# Patient Record
Sex: Female | Born: 1955 | Race: White | Hispanic: No | Marital: Married | Smoking: Former smoker
Health system: Southern US, Community
[De-identification: ages and names within clinical notes are randomized; demographics above are authoritative.]

## PROBLEM LIST (undated history)

## (undated) DIAGNOSIS — K219 Gastro-esophageal reflux disease without esophagitis: Secondary | ICD-10-CM

## (undated) DIAGNOSIS — I10 Essential (primary) hypertension: Secondary | ICD-10-CM

## (undated) DIAGNOSIS — M1990 Unspecified osteoarthritis, unspecified site: Secondary | ICD-10-CM

## (undated) DIAGNOSIS — F419 Anxiety disorder, unspecified: Secondary | ICD-10-CM

## (undated) HISTORY — PX: TONSILLECTOMY: SUR1361

## (undated) HISTORY — PX: TUBAL LIGATION: SHX77

## (undated) HISTORY — PX: ABDOMINAL HYSTERECTOMY: SHX81

## (undated) HISTORY — PX: STAPEDES SURGERY: SHX789

---

## 1997-07-15 ENCOUNTER — Ambulatory Visit (HOSPITAL_COMMUNITY): Admission: RE | Admit: 1997-07-15 | Discharge: 1997-07-15 | Payer: Self-pay | Admitting: Obstetrics and Gynecology

## 1998-11-02 ENCOUNTER — Other Ambulatory Visit: Admission: RE | Admit: 1998-11-02 | Discharge: 1998-11-02 | Payer: Self-pay | Admitting: Obstetrics and Gynecology

## 1999-08-15 ENCOUNTER — Encounter (INDEPENDENT_AMBULATORY_CARE_PROVIDER_SITE_OTHER): Payer: Self-pay | Admitting: Specialist

## 1999-08-15 ENCOUNTER — Inpatient Hospital Stay (HOSPITAL_COMMUNITY): Admission: RE | Admit: 1999-08-15 | Discharge: 1999-08-18 | Payer: Self-pay | Admitting: Obstetrics and Gynecology

## 2002-06-12 ENCOUNTER — Other Ambulatory Visit: Admission: RE | Admit: 2002-06-12 | Discharge: 2002-06-12 | Payer: Self-pay | Admitting: Obstetrics and Gynecology

## 2010-03-03 ENCOUNTER — Encounter: Admission: RE | Admit: 2010-03-03 | Discharge: 2010-03-03 | Payer: Self-pay | Admitting: Obstetrics and Gynecology

## 2010-09-15 NOTE — H&P (Signed)
[facility]  Patient:    Brittany Green, Brittany Green                        MRN: [id number] Adm. Date:  [id number] Attending:  Miguel Aschoff                         History and Physical  ADMISSION DIAGNOSIS:          Menometrorrhagia, pelvic pain, dyspareunia, probable adenomyosis.  BRIEF HISTORY:                The patient is a 55 year old white female para 3-0-0-3 status post prior tubal sterilization who has had a history of persistent irregular and heavy vaginal bleeding, initially evaluated and treated in 1999 at which time she underwent hysteroscopy and D&C, which revealed benign proliferative endometrium.  Following this surgery, patient continued to have heavy irregular  bleeding.  She was offered therapies including hysterectomy, Lupron therapy, or  endometrial ablation.  At that time, she elected to observe how her cycles went. However, pelvic pain and heavy bleeding persisted.  In addition, she developed ot flashes and was noted to have an elevation of her [facility].  She was treated because of her perimenopausal symptoms with ______ hormone replacement therapy, and in spite of this continued to have significant pelvic pain and dyspareunia.  At this point, she desired a definitive procedure be carried out, and at this time elected to proceed with total abdominal hysterectomy.  Patients last Pap smear, November 02, 1998, was satisfactory and within normal limits.  Patient denies any history of  nausea, vomiting, diarrhea, melena, or hematochezia.  She denies dysuria, frequency, urgency, or urinary stress symptoms.  PAST MEDICAL HISTORY:         Positive for history of hypertension, for which she is treated with Accupril and Toprol.  She denies asthma or diabetes.  PAST SURGICAL HISTORY:        Positive for prior tubal sterilization and prior &C.  FAMILY HISTORY:               Noncontributory.  REVIEW OF SYSTEMS:            Review of HEENT is  negative.  CARDIORESPIRATORY review is negative for shortness of breath, cough, or hemoptysis.  GI review is  negative for nausea, vomiting, diarrhea, melena, or hematochezia.  GU review - ee present illness.  GYN review - see present illness.  NEUROMUSCULAR review is negative.  PHYSICAL EXAMINATION:  GENERAL:                      Patient is a well-developed, well-nourished white  female in no acute distress.  VITAL SIGNS:                  Height 5 feet 3 inches, weight 130.  Temperature 97.2, pulse 62 and regular, respirations 18, blood pressure 150/92.  HEENT:                        Head is normocephalic, atraumatic.  Eyes show pupils to be equal and reactive to light and accommodation.  Sclerae white, conjunctivae pink.  Extraocular muscles with full range of motion.  Nose patent without gross lesion.  Tongue is moist and midline.  Throat is clear.  NECK:  Supple.  There is no adenopathy or jugular venous  distension noted.  No thyromegaly is noted.  HEART:                        Regular rhythm with no murmur or gallop.  BREASTS:                      Nontender, no masses are found.  ABDOMEN:                      Soft.  There is no evidence of enlargement of the  liver, kidneys, or spleen.  Abdomen is nontender.  Bowel sounds are active. There is no rebound or guarding.  BACK:                         Reveals no CVA tenderness or deformity.  PELVIC:                       Reveals normal external genitalia.  Normal BUS and Skenes glands, normal urethra.  Vaginal vault is without crusted lesions. Cervix is without crusted lesions.  Uterus is anterior, globular, approximately six to  eight weeks in size.  The adnexa reveal no definite masses.  Rectovaginal exam s confirmatory.  EXTREMITIES:                  Reveal no clubbing, cyanosis, or edema.  SKIN:                         Without gross lesion.  NEUROLOGIC:                   Grossly  intact.  IMPRESSION:                   Menometrorrhagia, pelvic pain, dysmenorrhea, and dyspareunia, suspected adenomyosis.  PLAN:                         Patient to undergo total abdominal hysterectomy and bilateral salpingo-oophorectomy and incidental appendectomy. DD:  08/15/99 TD:  08/15/99 Job: [id number]

## 2010-09-15 NOTE — Discharge Summary (Signed)
[facility]  Patient:    Brittany Green, Brittany Green                        MRN: [id number] Adm. Date:  [id number] Disc. Date: [id number] Attending:  Miguel Aschoff                           Discharge Summary  ADMISSION DIAGNOSES:          1. Menorrhagia.                               2. Pelvic pain.                               3. Dyspareunia.  DISCHARGE DIAGNOSES:          1. Menorrhagia.                               2. Pelvic pain.                               3. Dyspareunia.  PROCEDURE:                    Total abdominal hysterectomy, bilateral salpingo-oophorectomy, and appendectomy.  COMPLICATIONS:                None.  HISTORY OF PRESENT ILLNESS:   The patient is a 55 year old white female, para 3-0-0-3, who has had a prior tubal sterilization, has had a history of persistent, irregular, and very heavy vaginal bleeding.  This is also associated with pelvic pain and dyspareunia.  Because of the patients symptomatology she was offered a  variety of treatments which included Lupron therapy as well as endometrial ablation, but because of her symptoms and her request for definitive therapy, she was admitted to the hospital at this time to undergo total abdominal hysterectomy. In addition, the patient requested that at the time of this surgery an appendectomy be done.  HOSPITAL COURSE:              Preoperative studies were obtained.  This included an admission hemoglobin of 12.7, hematocrit of 38.6, white count 6000, PT and PTT ere within normal limits.  Chemistry profile was within normal limits.  Urinalysis as negative.  Under general anesthesia on August 15, 1999, a total abdominal hysterectomy and bilateral salpingo-oophorectomy were carried out without difficulty as well as an incidental appendectomy.  The patients postoperative course was essentially uncomplicated, however, she did have a drop of her hemoglobin to 8.7.  This stabilized and she  remained essentially asymptomatic tolerating increasing ambulation and diet well.  By April 20, she was in satisfactory condition and felt stable enough to be discharged home.  She was sent home on Tylox one every three hours as needed for pain.  She was instructed to o no heavy lifting, place nothing in the vagina.  DISCHARGE MEDICATIONS:        1. Chromagen one daily.                               2. Premarin 0.625 one daily.  FOLLOW-UP:  The patient will be seen back in four weeks for a  follow-up examination.  She knows to call if there are any problems such as fever, pain, or heavy bleeding.  Pathology report on the hysterectomy specimen revealed adenomyosis and leiomyoma of the uterus. DD:  09/05/99 TD:  09/05/99 Job: [id number]

## 2010-09-15 NOTE — Op Note (Signed)
[facility] [place]  Patient:    Brittany Green, Brittany Green                        MRN: [id number] Proc. Date: 08/15/99 Adm. Date:  [id number] Attending:  Miguel Aschoff                           Operative Report  PREOPERATIVE DIAGNOSIS:       Menometrorrhagia, pelvic pain, dyspareunia, dysmenorrhea.  POSTOPERATIVE DIAGNOSIS:      Menometrorrhagia, pelvic pain, dyspareunia, dysmenorrhea.  Probable adenomyosis.  OPERATION:                    Total abdominal hysterectomy, bilateral salpingo-oophorectomy, incidental appendectomy.  SURGEON:                      Miguel Aschoff, M.D.  ASSISTANT:                    Luvenia Redden, M.D.  ANESTHESIA:                   General anesthesia.  COMPLICATIONS:                None.  ESTIMATED BLOOD LOSS:  INDICATIONS:                  The patient is a 55 year old white female with a history of persistent irregular and heavy vaginal bleeding unresponsive to medical therapy and unresponsive to prior D&C.  In view of the irregular and heavy bleeding associated with pelvic pain, dysmenorrhea, and dyspareunia, she presents now to  undergo definitive therapy via total abdominal hysterectomy.  The patient has also requested that her appendix be removed if this is practical at the time of surgery. The risks and benefits of the procedure have been discussed with the patient.  DESCRIPTION OF PROCEDURE:     The patient was taken to the operating room and placed in the supine position and general anesthesia was administered without difficulty.  She was then prepped and draped in the usual sterile fashion.  A Foley catheter was inserted.  At this point a Pfannenstiel incision was then made. This was extended through the subcutaneous tissue until the fascia was identified. he fascia was then incised transversely.  It was separated from the underlying rectus muscles.  The rectus muscles were divided in the midline.  The peritoneum  was then identified and entered carefully avoiding underlying structures.  The peritoneal incision was extended under direct visualization.  Some filmy anterior omental adhesions were noted and these were taken down without difficulty.  Inspection f the pelvis showed the uterus to be anterior, globular, 6 to 8 weeks in size. The ovaries appeared to be within normal limits.  The tubes were within normal limits. The cul-de-sac was unremarkable.  The appendix was visualized and appeared to be unremarkable.  Intestinal surfaces were unremarkable.  At this point, a self retaining retractor was placed through the wound.  The viscera were packed out f the pelvis and then the uterus was elevated by moving across the utero-ovarian ligaments with Kelly clamps.  At this point, the round ligaments were identified, clamped, cut, and suture ligated using suture ligatures of 0 Vicryl.  The bladder flap was then created anteriorly.  The broad ligament was then skeletonized and  perforations were made bilaterally below the utero-ovarian ligaments and  then the infundibulopelvic ligaments were identified, clamped with curved Heaney clamps,  cut, and doubly ligated using ligatures of 0 Vicryl.  The broad ligament was then skeletonized.  The uterine vessels identified and these were clamped with curved Heaney clamps.  These pedicles were cut and then suture ligated using suture ligatures of 0 Vicryl.  Then in serial fashion, the paracervical fascia, cardinal ligaments, and uterosacral ligaments were clamped, cut, and suture ligated using straight Heaney clamps and suture ligatures of 0 Vicryl.  At this point, the vaginal vault was entered and the cervix was cut free from the vaginal fornices. The angles of the vaginal cuff were then ligated using figure-of-eight sutures f 0 Vicryl.  The uterosacral ligaments were incorporated into the suture ligature to provide support to the cuff.  Then the  cuff was run using running interlocking  Vicryl suture.  Then approximated in the midline using a single 0 Vicryl suture. Inspection was made for hemostasis.  Hemostasis appeared to be excellent.  At this point the pelvic floor was irrigated with warm saline and then reperitonealized  using running continuous 2-0 Vicryl suture.  In doing this, all pedicles were placed in retroperitoneal positions.  The appendix was then visualized.  The mesoappendix was skeletonized.  The base of the appendix identified and then perforation was made in the mesoappendix and two ligatures of 0 Vicryl were placed around the appendix and tied and the mesoappendix was tied using a ligature of  Vicryl.  The appendix was then elevated, cut free, and the base of the appendix was cauterized with electrocautery and the stump of the appendix covered over by using the residual portion of the mesoappendix.  Hemostasis appeared to be excellent t this point.  Lap counts were then taken and found to be correct.  At this point the abdomen was closed.  The parietoperitoneum was closed using running continuous  Vicryl suture.  The rectus muscles were reapproximated using running continuous 0 Vicryl suture.  The fascia was closed using two sutures of 0 Vicryl each starting at the lateral fascial angles and meeting in the midline.  The subcutaneous tissue and skin were closed using staples.  The estimated blood loss was approximately 200 cc.  The patient tolerated the procedure well and went to the recovery room in satisfactory condition. DD:  08/15/99 TD:  08/15/99 Job: [id number]

## 2011-02-14 ENCOUNTER — Other Ambulatory Visit: Payer: Self-pay | Admitting: Otolaryngology

## 2011-02-14 DIAGNOSIS — R220 Localized swelling, mass and lump, head: Secondary | ICD-10-CM

## 2011-02-16 ENCOUNTER — Ambulatory Visit
Admission: RE | Admit: 2011-02-16 | Discharge: 2011-02-16 | Disposition: A | Discharge status: Home / Self Care | Payer: Commercial Indemnity | Source: Ambulatory Visit | Attending: Otolaryngology | Admitting: Otolaryngology

## 2011-02-16 DIAGNOSIS — R220 Localized swelling, mass and lump, head: Secondary | ICD-10-CM

## 2011-02-16 MED ORDER — IOHEXOL 300 MG/ML  SOLN
75.0000 mL | Freq: Once | INTRAMUSCULAR | Status: AC | PRN
  Administered 2011-02-16: 75 mL via INTRAVENOUS

## 2011-11-22 IMAGING — CT CT NECK W/ CM
3 of 4 series · 16 of 33 positions shown, 19 images · IV contrast (omnipaque)
Comparison: None.

CLINICAL DATA: Lump in neck.  Tenderness comes and goes for 4
years.

CT NECK WITH CONTRAST
TECHNIQUE: Multidetector CT imaging of the neck was performed with
intravenous contrast.
Contrast: 75mL OMNIPAQUE IOHEXOL 300 MG/ML IV SOLN

[Series 2: axial neck · axial · 0.39mm/px · z∈[-82,+98]mm · 8 of 92 slices shown, 10 images]
[im 10/92  soft-tissue]
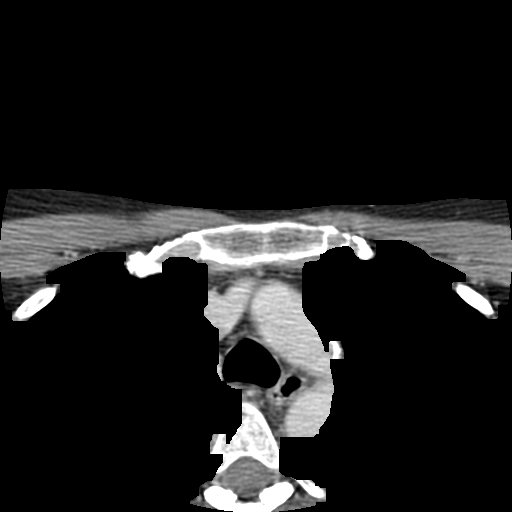
[im 10/92  bone]
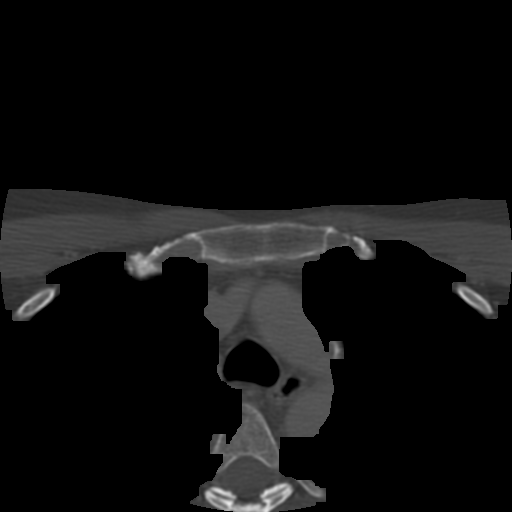
[im 19/92  bone]
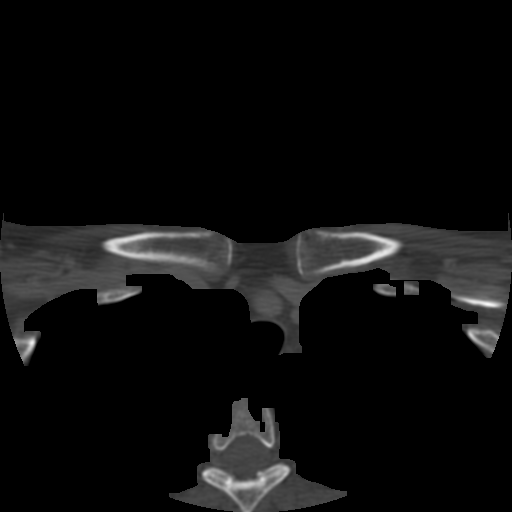
[im 28/92  bone]
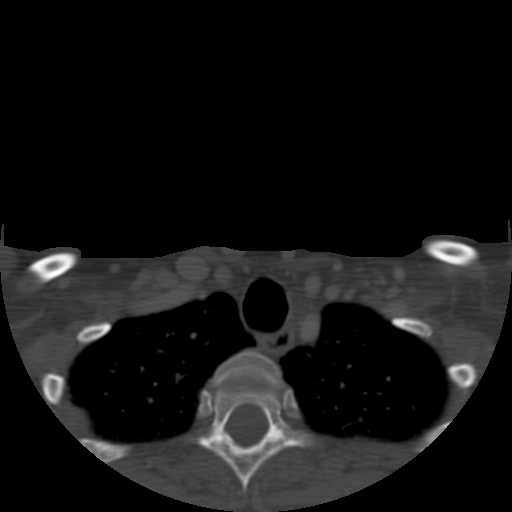
[im 37/92  bone]
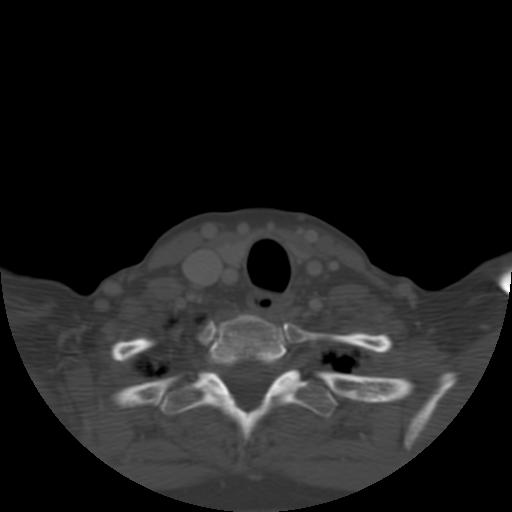
[im 55/92  soft-tissue]
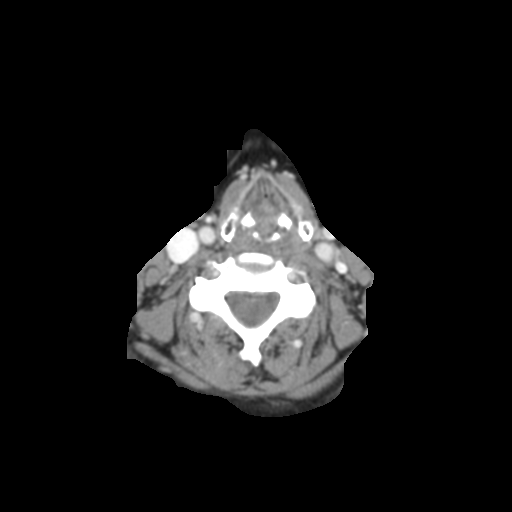
[im 55/92  bone]
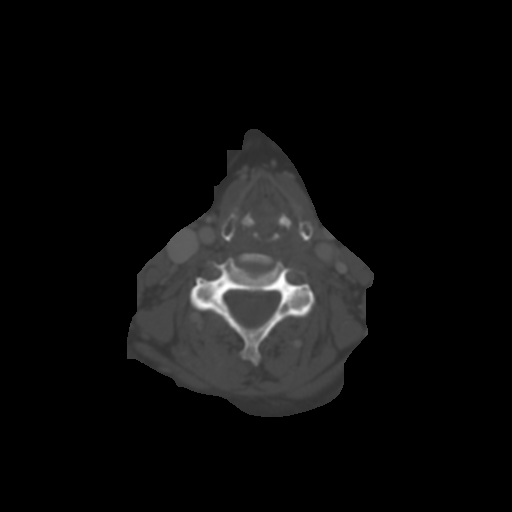
[im 64/92  bone]
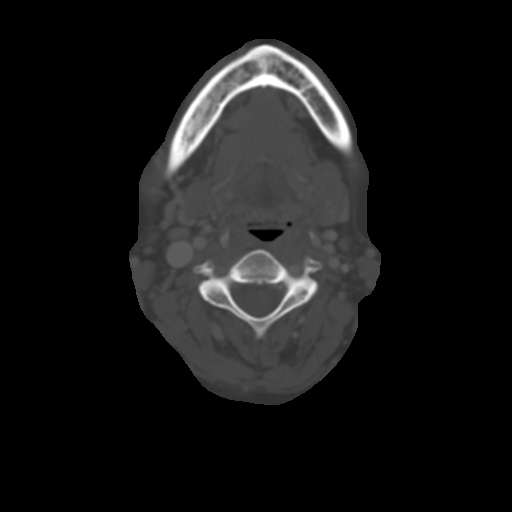
[im 73/92  bone]
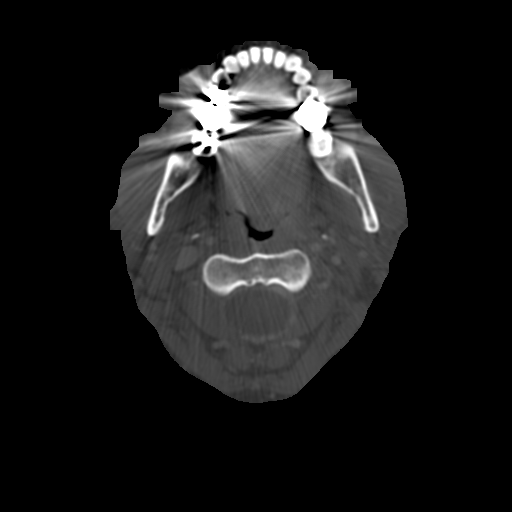
[im 82/92  bone]
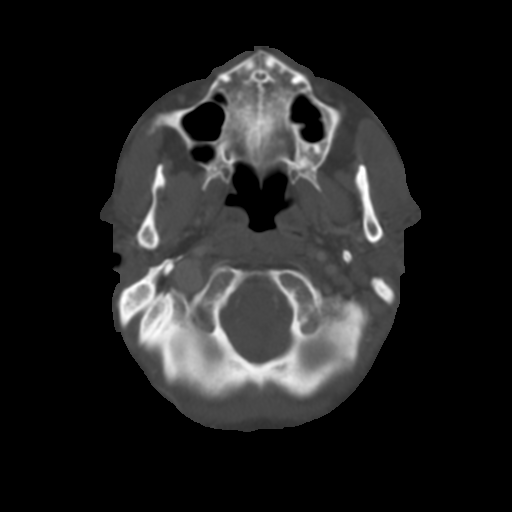

[Series 400: cor · coronal · 0.46mm/px · 3 of 70 slices shown]
[im 17/70  bone]
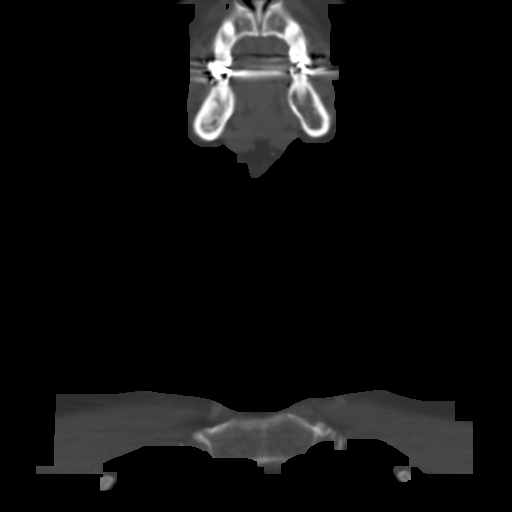
[im 29/70  bone]
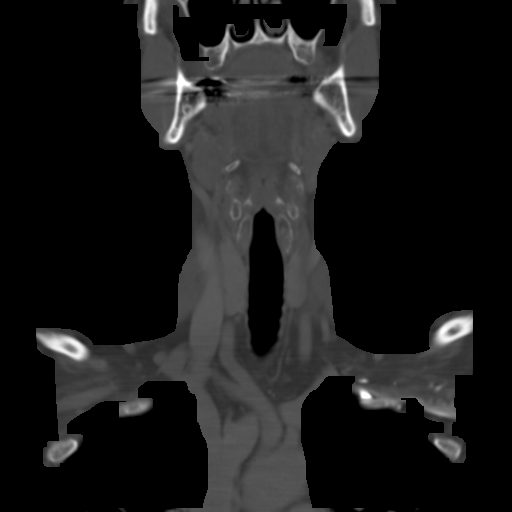
[im 41/70  bone]
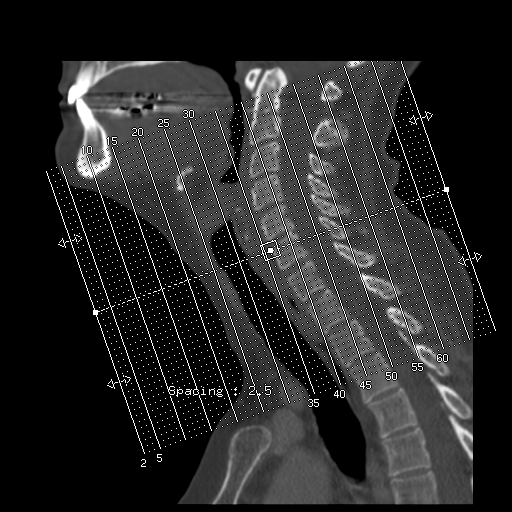

[Series 401: sag · sagittal · 0.46mm/px · 5 of 80 slices shown, 6 images]
[im 27/80  bone]
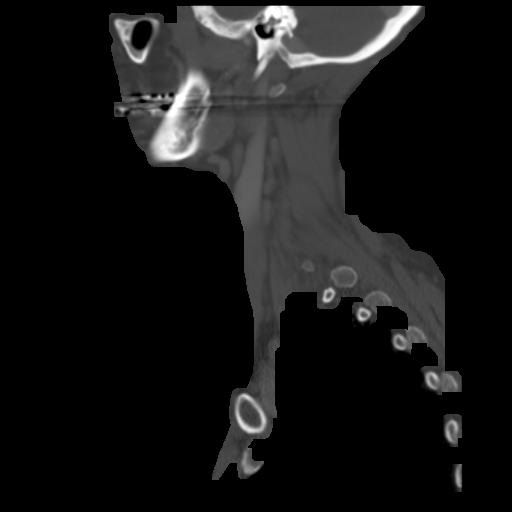
[im 33/80  bone]
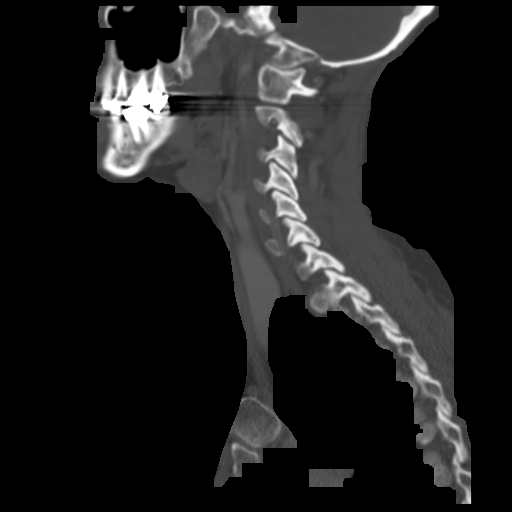
[im 40/80  soft-tissue]
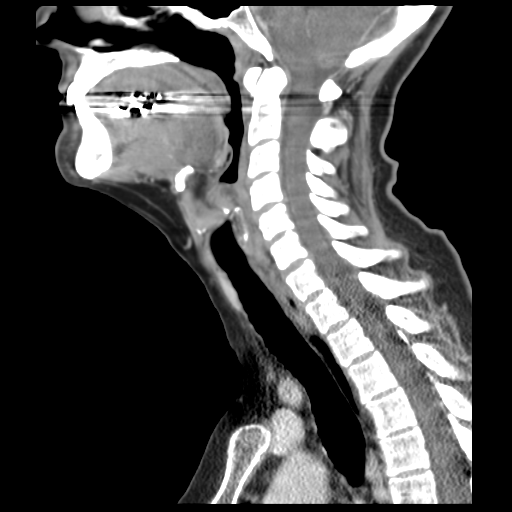
[im 40/80  bone]
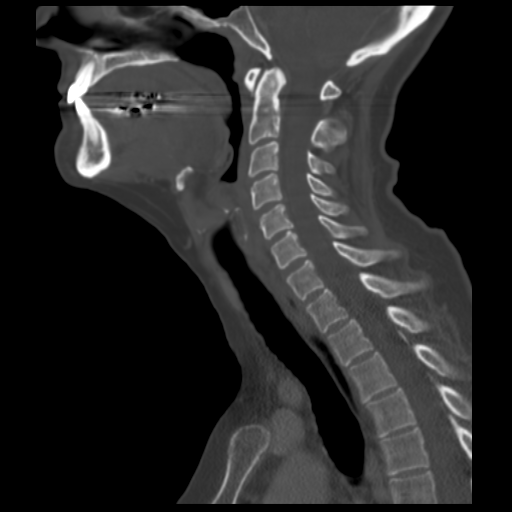
[im 47/80  bone]
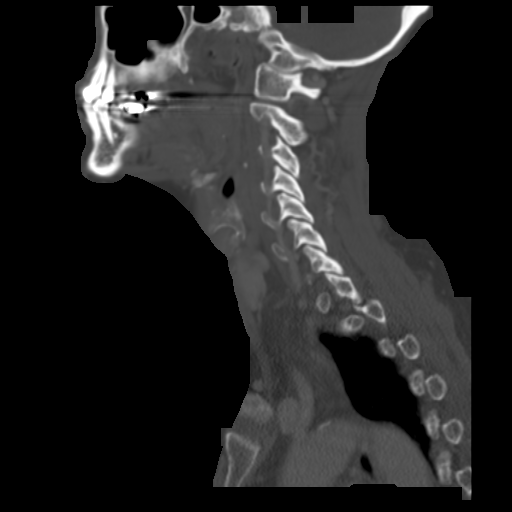
[im 53/80  bone]
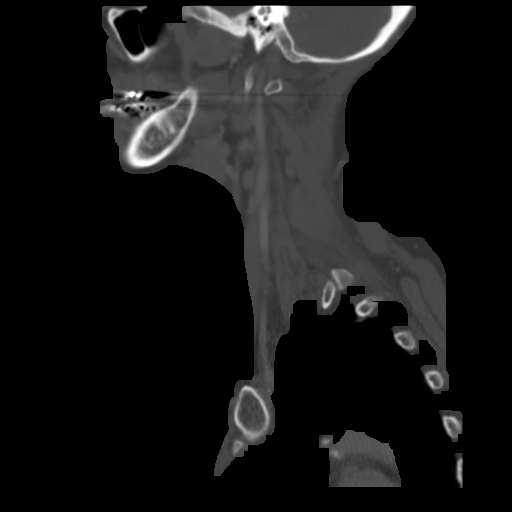

[16 of 33 positions shown; findings below may reference images not displayed]

FINDINGS: 3 mm low density lesion within the right lobe of the
thyroid gland.  No other findings of neck mass or inflammatory
process.

No adenopathy.

Bilateral lung apical parenchymal changes without associated bony
destruction suggestive of scarring.
IMPRESSION: Negative exam.  Please see above.

## 2013-06-03 ENCOUNTER — Encounter (HOSPITAL_COMMUNITY)
Admission: RE | Admit: 2013-06-03 | Discharge: 2013-06-03 | Disposition: A | Discharge status: Home / Self Care | Payer: Managed Care, Other (non HMO) | Source: Ambulatory Visit | Attending: Obstetrics and Gynecology | Admitting: Obstetrics and Gynecology

## 2013-06-03 ENCOUNTER — Encounter (HOSPITAL_COMMUNITY): Payer: Self-pay

## 2013-06-03 DIAGNOSIS — Z01810 Encounter for preprocedural cardiovascular examination: Secondary | ICD-10-CM | POA: Insufficient documentation

## 2013-06-03 DIAGNOSIS — Z01812 Encounter for preprocedural laboratory examination: Secondary | ICD-10-CM | POA: Insufficient documentation

## 2013-06-03 HISTORY — DX: Anxiety disorder, unspecified: F41.9

## 2013-06-03 HISTORY — DX: Essential (primary) hypertension: I10

## 2013-06-03 HISTORY — DX: Unspecified osteoarthritis, unspecified site: M19.90

## 2013-06-03 HISTORY — DX: Gastro-esophageal reflux disease without esophagitis: K21.9

## 2013-06-03 LAB — BASIC METABOLIC PANEL
BUN: 13 mg/dL (ref 6–23)
CALCIUM: 9.3 mg/dL (ref 8.4–10.5)
CO2: 30 mEq/L (ref 19–32)
Chloride: 99 mEq/L (ref 96–112)
Creatinine, Ser: 0.59 mg/dL (ref 0.50–1.10)
GFR calc Af Amer: >90 mL/min (ref >90)
GLUCOSE: 91 mg/dL (ref 70–99)
POTASSIUM: 3.3 meq/L — AB (ref 3.7–5.3)
Sodium: 140 mEq/L (ref 137–147)

## 2013-06-03 LAB — CBC
HEMATOCRIT: 40.9 % (ref 36.0–46.0)
HEMOGLOBIN: 13.8 g/dL (ref 12.0–15.0)
MCH: 29.2 pg (ref 26.0–34.0)
MCHC: 33.7 g/dL (ref 30.0–36.0)
MCV: 86.7 fL (ref 78.0–100.0)
Platelets: 233 10*3/uL (ref 150–400)
RBC: 4.72 MIL/uL (ref 3.87–5.11)
RDW: 12.7 % (ref 11.5–15.5)
WBC: 7.5 10*3/uL (ref 4.0–10.5)

## 2013-06-03 NOTE — Patient Instructions (Signed)
20 Carmelia RollerKathy M Ethington  06/03/2013   Your procedure is scheduled on:  06/10/13  [facility] through [facility]tr[facility] at 7 AM.  Pick up the phone at the desk and dial [phone].   Call this number if you have problems the morning of surgery: [phone]   Remember:   Do not eat food:After Midnight.  Do not drink clear liquids: After Midnight.  Take these medicines the morning of surgery with A SIP OF WATER: take blood pressure medication, reflux medication and may take Xanax if needed.   Do not wear jewelry, make-up or nail polish.  Do not wear lotions, powders, or perfumes. You may wear deodorant.  Do not shave 48 hours prior to surgery.  Do not bring valuables to the hospital.  [facility] is not   responsible for any belongings or valuables brought to the hospital.  Contacts, dentures or bridgework may not be worn into surgery.  Leave suitcase in the car. After surgery it may be brought to your room.  For patients admitted to the hospital, checkout time is 11:00 AM the day of              discharge.   Patients discharged the day of surgery will not be allowed to drive             home.  Name and phone number of your driver: NA  Special Instructions:   Shower using CHG 2 nights before surgery and the night before surgery.  If you shower the day of surgery use CHG.  Use special wash - you have one bottle of CHG for all showers.  You should use approximately 1/3 of the bottle for each shower.   Please read over the following fact sheets that you were given:   Surgical Site Infection Prevention

## 2013-06-09 ENCOUNTER — Other Ambulatory Visit: Payer: Self-pay | Admitting: Obstetrics and Gynecology

## 2013-06-09 NOTE — H&P (Signed)
58 y.o. yo complains of symptomatic cystocele.  Pt does not have SUI and cystometrics was unable to elicit leaking or LPP.  Normal capacity. Cystometrics"Pt did not leak with packing and has some unstable Detrusor. D/w pt over the phone doing Arepair alone, A repair with mesh just in case (but then increasing risk of worsening DI) and getting Uro consult for second opinion. Pt desires A repair only; understands still may have SUI after procedure but less likely given test results." Previously:"Bladder has been bothering for a while; coming out of vagina. Takes a while sometimes to initiate and then takes a while to come out. If bladder is really full will just come. No leaking with cough or sneeze. Occ burning sensation. No bleeding from vagina and occ trace blood on urine. No pain with SA. No leaking urine with sex either. Feels very heavy when standing a while. Has HTN and high chol; reflux. "   Occ leakage with stool on way to bathroom- mostly with what she eats. No problems with splinting. Not persistent diarrhea - occ constipation   Past Medical History  Diagnosis Date  . Hypertension   . GERD (gastroesophageal reflux disease)   . Anxiety   . Arthritis    Past Surgical History  Procedure Laterality Date  . Abdominal hysterectomy    . Stapedes surgery Right 2005?  Marland Kitchen. Tubal ligation    . Tonsillectomy      History   Social History  . Marital Status: Married    Spouse Name: N/A    Number of Children: N/A  . Years of Education: N/A   Occupational History  . Not on file.   Social History Main Topics  . Smoking status: Former Games developermoker  . Smokeless tobacco: Not on file  . Alcohol Use: Yes     Comment: rarely  . Drug Use: No  . Sexual Activity: Not on file   Other Topics Concern  . Not on file   Social History Narrative  . No narrative on file    No current facility-administered medications on file prior to encounter.   No current outpatient prescriptions on file prior to  encounter.    Not on File  @VITALS2 @  Lungs: clear to ascultation Cor:  RRR Abdomen:  soft, nontender, nondistended. Ex:  no cords, erythema Pelvic:  Female Genitalia: Vulva: no masses, atrophy, or lesions. Bladder/Urethra: no urethral discharge or mass and normal meatus and bladder non distended. Vagina no tenderness, erythema, abnormal vaginal discharge, or vesicle(s) or ulcers and cystocele (0 to +1; some urethral hyper mobility;) and rectocele (-2, cuff suspended). Adnexa/Parametria: no parametrial tenderness or mass and no adnexal tenderness or ovarian mass   A:  Cystocele for anterior repair alone.     P: All risks, benefits and alternatives d/w patient and she desires to proceed.  She will receive preop antibiotics and SCDs during the operation.     Thorn Demas A

## 2013-06-10 ENCOUNTER — Ambulatory Visit (HOSPITAL_COMMUNITY): Payer: Managed Care, Other (non HMO) | Admitting: Anesthesiology

## 2013-06-10 ENCOUNTER — Encounter (HOSPITAL_COMMUNITY)
Admission: RE | Disposition: A | Discharge status: Home / Self Care | Payer: Self-pay | Source: Ambulatory Visit | Attending: Obstetrics and Gynecology

## 2013-06-10 ENCOUNTER — Ambulatory Visit (HOSPITAL_COMMUNITY)
Admission: RE | Admit: 2013-06-10 | Discharge: 2013-06-10 | Disposition: A | Discharge status: Home / Self Care | Payer: Managed Care, Other (non HMO) | Source: Ambulatory Visit | Attending: Obstetrics and Gynecology | Admitting: Obstetrics and Gynecology

## 2013-06-10 ENCOUNTER — Encounter (HOSPITAL_COMMUNITY): Payer: Managed Care, Other (non HMO) | Admitting: Anesthesiology

## 2013-06-10 ENCOUNTER — Encounter (HOSPITAL_COMMUNITY): Payer: Self-pay | Admitting: Anesthesiology

## 2013-06-10 DIAGNOSIS — Z87891 Personal history of nicotine dependence: Secondary | ICD-10-CM | POA: Insufficient documentation

## 2013-06-10 DIAGNOSIS — K219 Gastro-esophageal reflux disease without esophagitis: Secondary | ICD-10-CM | POA: Insufficient documentation

## 2013-06-10 DIAGNOSIS — N993 Prolapse of vaginal vault after hysterectomy: Secondary | ICD-10-CM | POA: Insufficient documentation

## 2013-06-10 DIAGNOSIS — I10 Essential (primary) hypertension: Secondary | ICD-10-CM | POA: Insufficient documentation

## 2013-06-10 DIAGNOSIS — IMO0002 Reserved for concepts with insufficient information to code with codable children: Secondary | ICD-10-CM | POA: Diagnosis present

## 2013-06-10 HISTORY — PX: CYSTOCELE REPAIR: SHX163

## 2013-06-10 SURGERY — COLPORRHAPHY, ANTERIOR, FOR CYSTOCELE REPAIR
Anesthesia: General | Site: Vagina

## 2013-06-10 MED ORDER — ONDANSETRON HCL 4 MG/2ML IJ SOLN
INTRAMUSCULAR | Status: AC
  Filled 2013-06-10: qty 2

## 2013-06-10 MED ORDER — FENTANYL CITRATE 0.05 MG/ML IJ SOLN
INTRAMUSCULAR | Status: AC
  Filled 2013-06-10: qty 5

## 2013-06-10 MED ORDER — LIDOCAINE-EPINEPHRINE 0.5 %-1:200000 IJ SOLN
INTRAMUSCULAR | Status: DC | PRN
  Administered 2013-06-10: 6 mL

## 2013-06-10 MED ORDER — LIDOCAINE-EPINEPHRINE 0.5 %-1:200000 IJ SOLN
INTRAMUSCULAR | Status: AC
  Filled 2013-06-10: qty 1

## 2013-06-10 MED ORDER — CEFAZOLIN SODIUM-DEXTROSE 2-3 GM-% IV SOLR
2.0000 g | INTRAVENOUS | Status: AC
  Administered 2013-06-10: 2 g via INTRAVENOUS

## 2013-06-10 MED ORDER — FENTANYL CITRATE 0.05 MG/ML IJ SOLN: 25.0000 ug | INTRAMUSCULAR | Status: DC | PRN

## 2013-06-10 MED ORDER — KETOROLAC TROMETHAMINE 30 MG/ML IJ SOLN: 15.0000 mg | Freq: Once | INTRAMUSCULAR | Status: DC | PRN

## 2013-06-10 MED ORDER — KETOROLAC TROMETHAMINE 30 MG/ML IJ SOLN
INTRAMUSCULAR | Status: AC
  Filled 2013-06-10: qty 1

## 2013-06-10 MED ORDER — ESTRADIOL 0.1 MG/GM VA CREA
TOPICAL_CREAM | VAGINAL | Status: AC
  Filled 2013-06-10: qty 42.5

## 2013-06-10 MED ORDER — OXYCODONE-ACETAMINOPHEN 5-325 MG PO TABS: 1.0000 | ORAL_TABLET | ORAL | Status: AC | PRN

## 2013-06-10 MED ORDER — CEFAZOLIN SODIUM-DEXTROSE 2-3 GM-% IV SOLR
INTRAVENOUS | Status: AC
  Filled 2013-06-10: qty 50

## 2013-06-10 MED ORDER — OXYCODONE-ACETAMINOPHEN 5-325 MG PO TABS: 1.0000 | ORAL_TABLET | ORAL | Status: DC | PRN

## 2013-06-10 MED ORDER — DEXAMETHASONE SODIUM PHOSPHATE 4 MG/ML IJ SOLN
INTRAMUSCULAR | Status: DC | PRN
  Administered 2013-06-10: 10 mg via INTRAVENOUS

## 2013-06-10 MED ORDER — LIDOCAINE HCL (CARDIAC) 20 MG/ML IV SOLN
INTRAVENOUS | Status: DC | PRN
  Administered 2013-06-10: 50 mg via INTRAVENOUS

## 2013-06-10 MED ORDER — PROPOFOL 10 MG/ML IV BOLUS
INTRAVENOUS | Status: DC | PRN
  Administered 2013-06-10: 200 mg via INTRAVENOUS

## 2013-06-10 MED ORDER — MIDAZOLAM HCL 5 MG/5ML IJ SOLN
INTRAMUSCULAR | Status: DC | PRN
  Administered 2013-06-10: 2 mg via INTRAVENOUS

## 2013-06-10 MED ORDER — DEXAMETHASONE SODIUM PHOSPHATE 10 MG/ML IJ SOLN
INTRAMUSCULAR | Status: AC
  Filled 2013-06-10: qty 1

## 2013-06-10 MED ORDER — PROPOFOL 10 MG/ML IV EMUL
INTRAVENOUS | Status: AC
  Filled 2013-06-10: qty 20

## 2013-06-10 MED ORDER — KETOROLAC TROMETHAMINE 30 MG/ML IJ SOLN
INTRAMUSCULAR | Status: DC | PRN
  Administered 2013-06-10: 30 mg via INTRAVENOUS

## 2013-06-10 MED ORDER — LACTATED RINGERS IV SOLN
INTRAVENOUS | Status: DC
  Administered 2013-06-10 (×2): via INTRAVENOUS

## 2013-06-10 MED ORDER — MEPERIDINE HCL 25 MG/ML IJ SOLN: 6.2500 mg | INTRAMUSCULAR | Status: DC | PRN

## 2013-06-10 MED ORDER — ONDANSETRON HCL 4 MG/2ML IJ SOLN
INTRAMUSCULAR | Status: DC | PRN
  Administered 2013-06-10: 4 mg via INTRAVENOUS

## 2013-06-10 MED ORDER — ONDANSETRON HCL 4 MG/2ML IJ SOLN: 4.0000 mg | Freq: Once | INTRAMUSCULAR | Status: DC | PRN

## 2013-06-10 MED ORDER — MIDAZOLAM HCL 2 MG/2ML IJ SOLN
INTRAMUSCULAR | Status: AC
  Filled 2013-06-10: qty 2

## 2013-06-10 MED ORDER — LIDOCAINE HCL (CARDIAC) 20 MG/ML IV SOLN
INTRAVENOUS | Status: AC
  Filled 2013-06-10: qty 5

## 2013-06-10 MED ORDER — FENTANYL CITRATE 0.05 MG/ML IJ SOLN
INTRAMUSCULAR | Status: DC | PRN
  Administered 2013-06-10: 100 ug via INTRAVENOUS

## 2013-06-10 SURGICAL SUPPLY — 19 items
CANISTER SUCT 3000ML (MISCELLANEOUS) ×3 IMPLANT
CLOTH BEACON ORANGE TIMEOUT ST (SAFETY) ×3 IMPLANT
CONT PATH 16OZ SNAP LID 3702 (MISCELLANEOUS) IMPLANT
DECANTER SPIKE VIAL GLASS SM (MISCELLANEOUS) IMPLANT
GAUZE PACKING 2X5 YD STRL (GAUZE/BANDAGES/DRESSINGS) IMPLANT
GLOVE BIO SURGEON STRL SZ7 (GLOVE) ×3 IMPLANT
GOWN STRL REUS W/TWL LRG LVL3 (GOWN DISPOSABLE) ×3 IMPLANT
GOWN STRL REUS W/TWL XL LVL3 (GOWN DISPOSABLE) ×3 IMPLANT
NEEDLE HYPO 22GX1.5 SAFETY (NEEDLE) ×3 IMPLANT
NEEDLE SPNL 22GX3.5 QUINCKE BK (NEEDLE) IMPLANT
NS IRRIG 1000ML POUR BTL (IV SOLUTION) ×3 IMPLANT
PACK VAGINAL WOMENS (CUSTOM PROCEDURE TRAY) ×3 IMPLANT
SUT VIC AB 0 CT1 18XCR BRD8 (SUTURE) ×1 IMPLANT
SUT VIC AB 0 CT1 8-18 (SUTURE) ×2
SUT VIC AB 2-0 CT1 27 (SUTURE) ×8
SUT VIC AB 2-0 CT1 TAPERPNT 27 (SUTURE) ×4 IMPLANT
TOWEL OR 17X24 6PK STRL BLUE (TOWEL DISPOSABLE) ×6 IMPLANT
TRAY FOLEY CATH 14FR (SET/KITS/TRAYS/PACK) ×3 IMPLANT
WATER STERILE IRR 1000ML POUR (IV SOLUTION) ×3 IMPLANT

## 2013-06-10 NOTE — Discharge Instructions (Signed)

## 2013-06-10 NOTE — Progress Notes (Signed)
Phone update- pt voiding and not having bleeding.  Ok to d/c.

## 2013-06-10 NOTE — Progress Notes (Signed)
Patient up to bathroom @ 1130.Marland Kitchen. Having small amount vaginal bleeding on arrival to the bathroom.  At 1145 unable to void. Large amount vaginal bleeding notied with clots in the commode. Unable to void. Patient becoming uncomfortable. Patient assisted back to bed at 1150. Dr. Henderson CloudHorvath notified of patients statis. Dr. Henderson CloudHorvath to admit patient, Extended Recovery and to be I&O cathed in the PACU.

## 2013-06-10 NOTE — Anesthesia Preprocedure Evaluation (Signed)
Anesthesia Evaluation  Patient identified by MRN, date of birth, ID band Patient awake    Reviewed: Allergy & Precautions, H&P , NPO status , Patient's Chart, lab work & pertinent test results, reviewed documented beta blocker date and time   Airway Mallampati: I TM Distance: >3 FB Neck ROM: full    Dental no notable dental hx. (+) Teeth Intact   Pulmonary neg pulmonary ROS, former smoker,          Cardiovascular hypertension, Pt. on medications     Neuro/Psych negative neurological ROS     GI/Hepatic Neg liver ROS, GERD-  Medicated and Controlled,  Endo/Other  negative endocrine ROS  Renal/GU negative Renal ROS     Musculoskeletal   Abdominal Normal abdominal exam  (+)   Peds  Hematology negative hematology ROS (+)   Anesthesia Other Findings   Reproductive/Obstetrics negative OB ROS                           Anesthesia Physical Anesthesia Plan  ASA: II  Anesthesia Plan: General   Post-op Pain Management:    Induction: Intravenous  Airway Management Planned: LMA  Additional Equipment:   Intra-op Plan:   Post-operative Plan:   Informed Consent: I have reviewed the patients History and Physical, chart, labs and discussed the procedure including the risks, benefits and alternatives for the proposed anesthesia with the patient or authorized representative who has indicated his/her understanding and acceptance.     Plan Discussed with: CRNA and Surgeon  Anesthesia Plan Comments:         Anesthesia Quick Evaluation

## 2013-06-10 NOTE — Progress Notes (Signed)
Patient voided x3 total.  150cc pink urine, no clots or red blood noted.  Patient has scant vaginal bleeding on peri pad.  Tolerating liquids, diet advanced to regular.  Will continue to monitor.  Osvaldo AngstK. Phiona Ramnauth, RN---------------------

## 2013-06-10 NOTE — Brief Op Note (Addendum)
06/10/2013  9:40 AM  PATIENT:  Carmelia RollerKathy M Burgo  58 y.o. female  PRE-OPERATIVE DIAGNOSIS:  CYSTOCELE  POST-OPERATIVE DIAGNOSIS:  same  PROCEDURE:  Procedure(s): ANTERIOR REPAIR (CYSTOCELE) (N/A)  SURGEON:  Surgeon(s) and Role:    * Loney LaurenceMichelle A Kayna Suppa, MD - Primary    * W Lodema HongScott Bowie, MD - Assisting   ANESTHESIA:   general  EBL:  Total I/O In: 1000 [I.V.:1000] Out: - min   LOCAL MEDICATIONS USED:  LIDOCAINE   SPECIMEN:  No Specimen  DISPOSITION OF SPECIMEN:  N/A  COUNTS:  YES  TOURNIQUET:  * No tourniquets in log *  DICTATION: .Note written in EPIC  PLAN OF CARE: Discharge to home after PACU  PATIENT DISPOSITION:  PACU - hemodynamically stable.   Delay start of Pharmacological VTE agent (>24hrs) due to surgical blood loss or risk of bleeding: not applicable  Complications:none.  Findings: cystocele to 0 at hymen  Meds:  0.5% xylocaine with epi  Technique:  After adequate anesthesia was achieved, the patient was prepped and draped in the usual fashion; the bladder was emptied with a foley catheter.  The perineum was grasped with allises and the midline was grasped all the way to the top of the cystocele with allises.  Lidocaine was injected submucosally and then incised in the midline vertically.  The mucosa was then retracted off the vesicovaginal fascia with sharp and blunt dissection.  The bladder remained intact throughout the procedure.  A 0 Vicryl  mattress suture was placed on either side of the mid-urethra for a modified Kelly plication and and additional three mattress stitches were then used to close the vesicovaginal fascia over the defect.  The vaginal mucosa was then closed with a running lock stitch of 2-0 vicryl.  No vaginal pack was needed.  The patient tolerated the procedure well and was returned to the PACU in stable condition.  Daleyza Gadomski A

## 2013-06-10 NOTE — Progress Notes (Signed)
There has been no change in the patients history, status or exam since the history and physical.  Filed Vitals:   06/10/13 0658 06/10/13 0701  BP:  151/78  Pulse: 68   Temp: 98.2 F (36.8 C)   TempSrc: Oral   Resp: 20   SpO2: 100%     Lab Results  Component Value Date   WBC 7.5 06/03/2013   HGB 13.8 06/03/2013   HCT 40.9 06/03/2013   MCV 86.7 06/03/2013   PLT 233 06/03/2013    Brittany Green A

## 2013-06-10 NOTE — Op Note (Signed)
06/10/2013  9:40 AM  PATIENT:  Brittany Green  58 y.o. female  PRE-OPERATIVE DIAGNOSIS:  CYSTOCELE  POST-OPERATIVE DIAGNOSIS:  same  PROCEDURE:  Procedure(s): ANTERIOR REPAIR (CYSTOCELE) (N/A)  SURGEON:  Surgeon(s) and Role:    * Aadam Zhen A Jahyra Sukup, MD - Primary    * W Scott Bowie, MD - Assisting   ANESTHESIA:   general  EBL:  Total I/O In: 1000 [I.V.:1000] Out: - min   LOCAL MEDICATIONS USED:  LIDOCAINE   SPECIMEN:  No Specimen  DISPOSITION OF SPECIMEN:  N/A  COUNTS:  YES  TOURNIQUET:  * No tourniquets in log *  DICTATION: .Note written in EPIC  PLAN OF CARE: Discharge to home after PACU  PATIENT DISPOSITION:  PACU - hemodynamically stable.   Delay start of Pharmacological VTE agent (>24hrs) due to surgical blood loss or risk of bleeding: not applicable  Complications:none.  Findings: cystocele to 0 at hymen  Meds:  0.5% xylocaine with epi  Technique:  After adequate anesthesia was achieved, the patient was prepped and draped in the usual fashion; the bladder was emptied with a foley catheter.  The perineum was grasped with allises and the midline was grasped all the way to the top of the cystocele with allises.  Lidocaine was injected submucosally and then incised in the midline vertically.  The mucosa was then retracted off the vesicovaginal fascia with sharp and blunt dissection.  The bladder remained intact throughout the procedure.  A 0 Vicryl  mattress suture was placed on either side of the mid-urethra for a modified Kelly plication and and additional three mattress stitches were then used to close the vesicovaginal fascia over the defect.  The vaginal mucosa was then closed with a running lock stitch of 2-0 vicryl.  No vaginal pack was needed.  The patient tolerated the procedure well and was returned to the PACU in stable condition.  Monice Lundy A   

## 2013-06-10 NOTE — Transfer of Care (Signed)
Immediate Anesthesia Transfer of Care Note  Patient: Carmelia RollerKathy M Huguley  Procedure(s) Performed: Procedure(s): ANTERIOR REPAIR (CYSTOCELE) (N/A)  Patient Location: PACU  Anesthesia Type:General  Level of Consciousness: awake, alert  and oriented  Airway & Oxygen Therapy: Patient Spontanous Breathing and Patient connected to nasal cannula oxygen  Post-op Assessment: Report given to PACU RN and Post -op Vital signs reviewed and stable  Post vital signs: Reviewed and stable  Complications: No apparent anesthesia complications

## 2013-06-10 NOTE — Anesthesia Postprocedure Evaluation (Signed)
Anesthesia Post Note  Patient: Brittany Green  Procedure(s) Performed: Procedure(s) (LRB): ANTERIOR REPAIR (CYSTOCELE) (N/A)  Anesthesia type: General  Patient location: PACU  Post pain: Pain level controlled  Post assessment: Post-op Vital signs reviewed  Last Vitals:  Filed Vitals:   06/10/13 1000  BP: 135/69  Pulse: 69  Temp:   Resp: 16    Post vital signs: Reviewed  Level of consciousness: sedated  Complications: No apparent anesthesia complications

## 2013-06-10 NOTE — Progress Notes (Signed)
Discharge instructions provided to patient and significant other at bedside.  Activity, medications, when to call the doctor, follow up appointments and community resources discussed.  No questions at this time.  Patient left unit in stable condition with all personal belongings and prescriptions accompanied by staff.  Osvaldo AngstK. Andraya Frigon, RN------------

## 2013-06-11 ENCOUNTER — Encounter (HOSPITAL_COMMUNITY): Payer: Self-pay | Admitting: Obstetrics and Gynecology

## 2016-01-20 ENCOUNTER — Telehealth: Payer: Self-pay | Admitting: *Deleted

## 2016-01-20 NOTE — Telephone Encounter (Signed)
Pt left name and phone number only.  I encouraged pt to leave an question that I could respond to, and often it would be answered with the 1st call back, and that I triage calls by urgency of the request. Pt states she had been contacted by someone and she had set up an appt for her husband.

## 2016-03-20 ENCOUNTER — Other Ambulatory Visit (HOSPITAL_COMMUNITY): Payer: Self-pay | Admitting: Gastroenterology

## 2016-03-20 DIAGNOSIS — K760 Fatty (change of) liver, not elsewhere classified: Secondary | ICD-10-CM

## 2016-03-20 DIAGNOSIS — R748 Abnormal levels of other serum enzymes: Secondary | ICD-10-CM

## 2016-04-04 ENCOUNTER — Ambulatory Visit (HOSPITAL_COMMUNITY)
Admission: RE | Admit: 2016-04-04 | Discharge: 2016-04-04 | Disposition: A | Discharge status: Home / Self Care | Payer: BLUE CROSS/BLUE SHIELD | Source: Ambulatory Visit | Attending: Gastroenterology | Admitting: Gastroenterology

## 2016-04-04 DIAGNOSIS — K760 Fatty (change of) liver, not elsewhere classified: Secondary | ICD-10-CM | POA: Diagnosis present

## 2016-04-04 DIAGNOSIS — R748 Abnormal levels of other serum enzymes: Secondary | ICD-10-CM

## 2016-07-04 ENCOUNTER — Other Ambulatory Visit: Payer: Self-pay | Admitting: Obstetrics and Gynecology

## 2016-07-06 ENCOUNTER — Other Ambulatory Visit: Payer: Self-pay | Admitting: Obstetrics and Gynecology

## 2016-07-06 DIAGNOSIS — N6310 Unspecified lump in the right breast, unspecified quadrant: Secondary | ICD-10-CM

## 2016-07-24 ENCOUNTER — Ambulatory Visit
Admission: RE | Admit: 2016-07-24 | Discharge: 2016-07-24 | Disposition: A | Discharge status: Home / Self Care | Payer: BLUE CROSS/BLUE SHIELD | Source: Ambulatory Visit | Attending: Obstetrics and Gynecology | Admitting: Obstetrics and Gynecology

## 2016-07-24 DIAGNOSIS — N6310 Unspecified lump in the right breast, unspecified quadrant: Secondary | ICD-10-CM

## 2017-03-17 IMAGING — US US ABDOMEN COMPLETE W/ ELASTOGRAPHY
1 series · 13 of 17 positions shown · non-contrast
Comparison: Abdomen ultrasound on 09/14/2015 from [facility]

CLINICAL DATA: Hepatic steatosis. Elevated alkaline phosphatase
level.



[Series 1: us abdomen complete w/ elastography · 0.17mm/px · 13 of 17 slices shown]
[im 1/17]
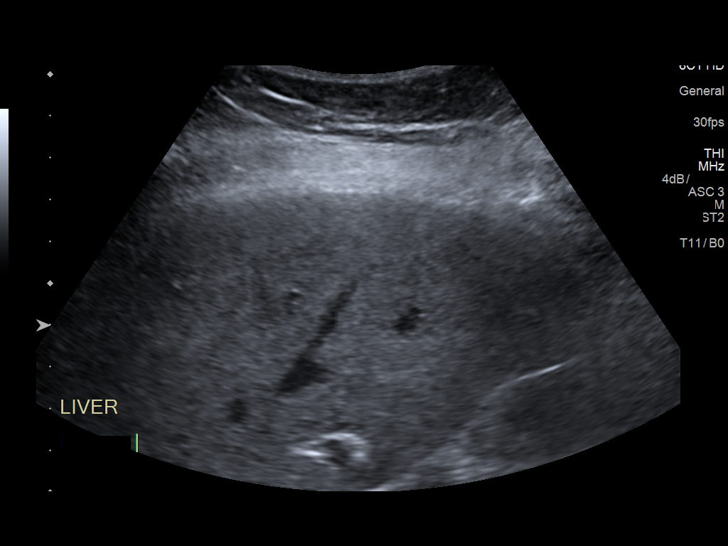
[im 2/17]
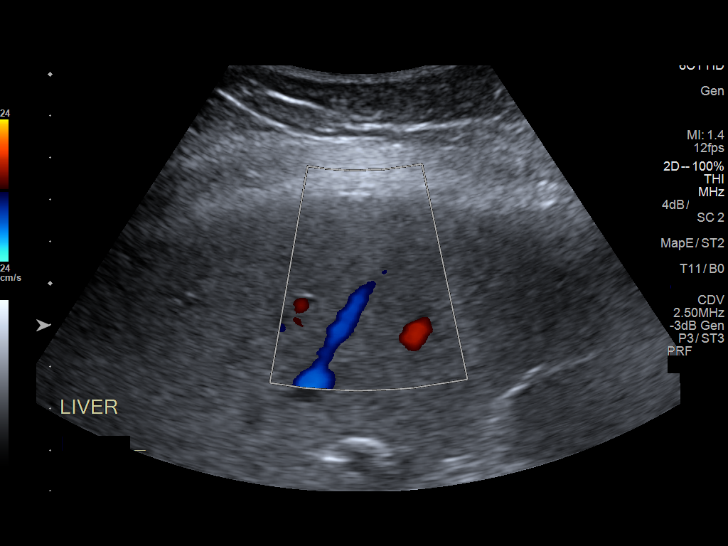
[im 4/17]
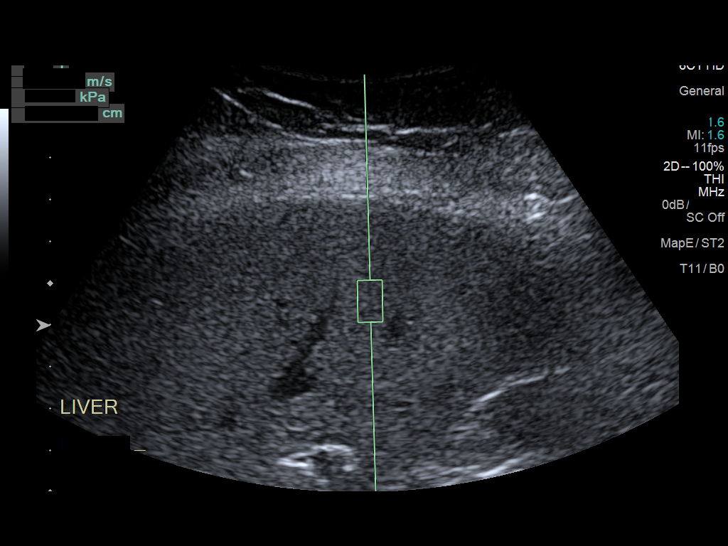
[im 5/17]
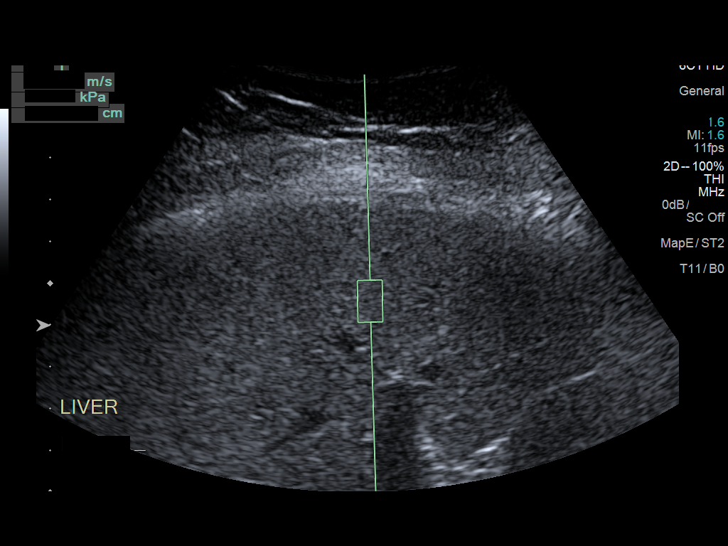
[im 6/17]
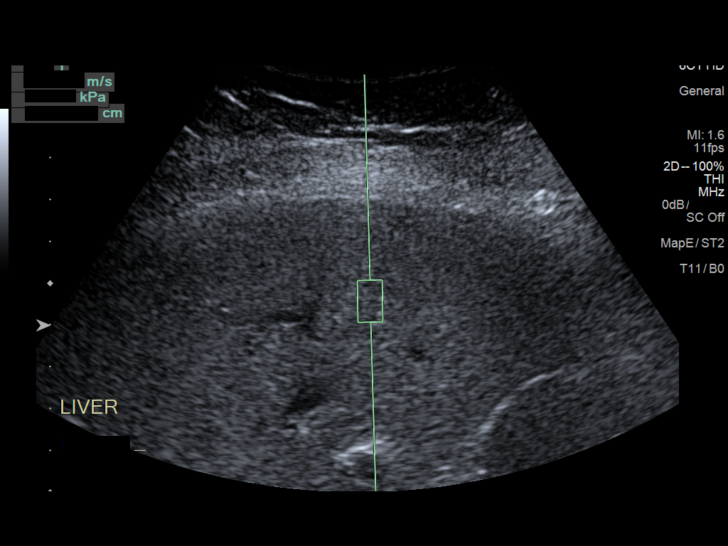
[im 8/17]
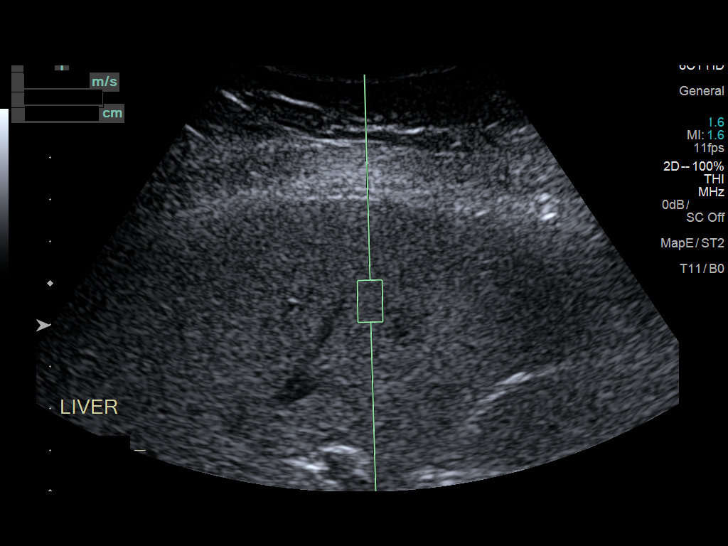
[im 9/17]
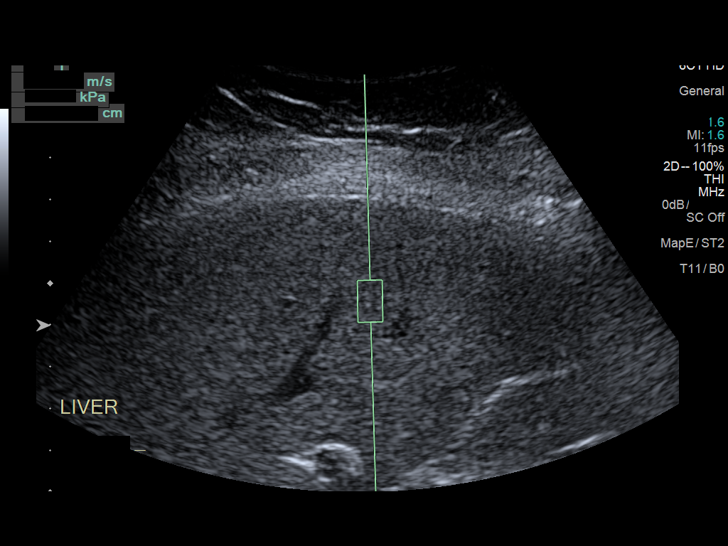
[im 10/17]
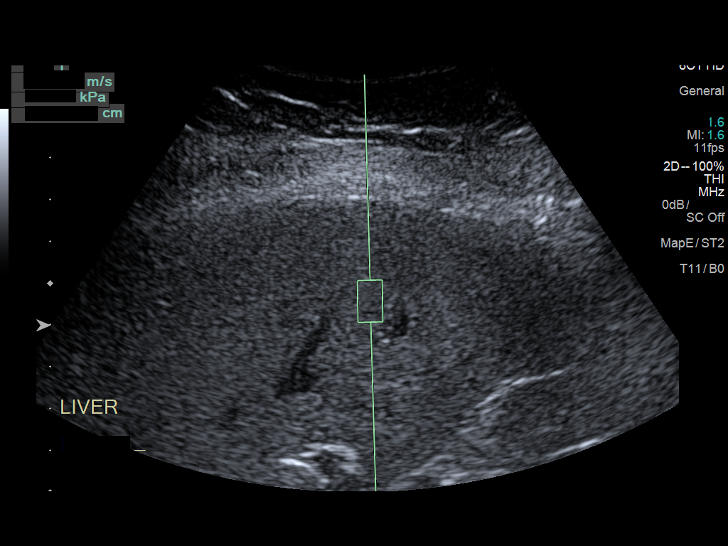
[im 12/17]
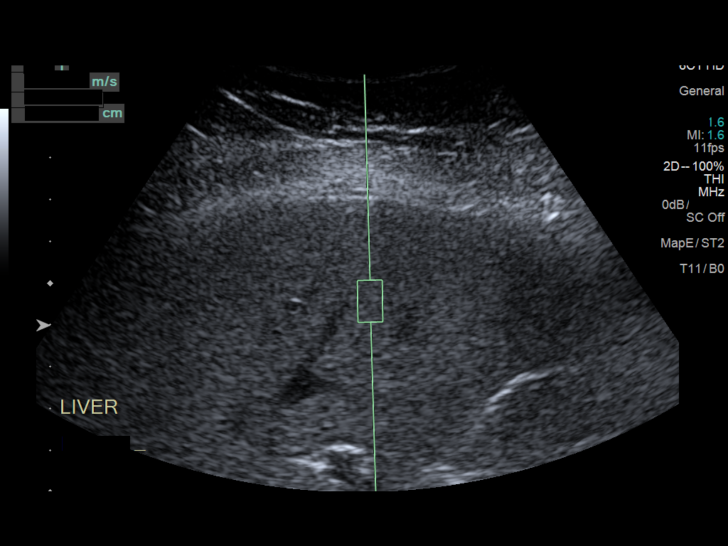
[im 13/17]
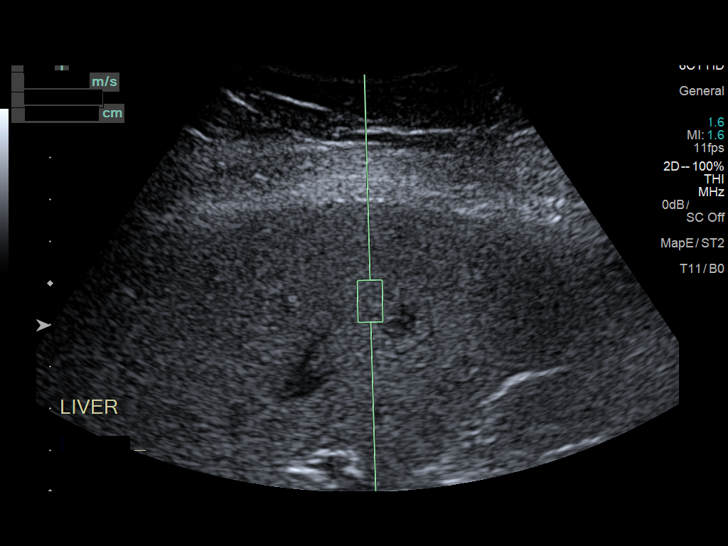
[im 14/17]
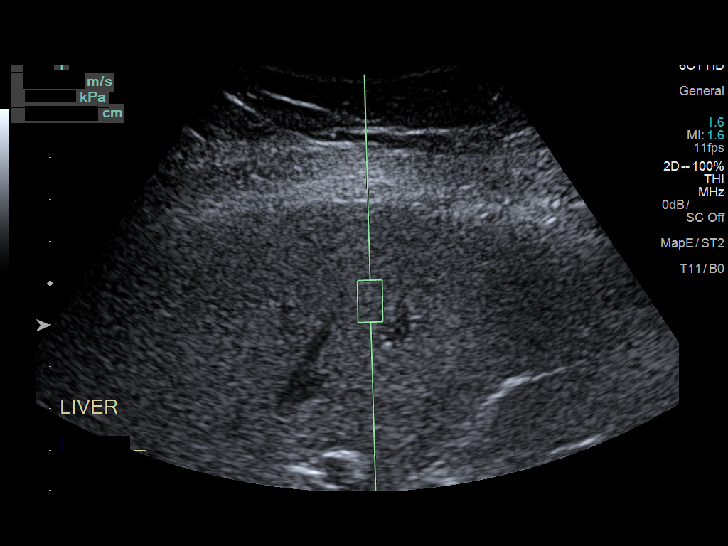
[im 16/17]
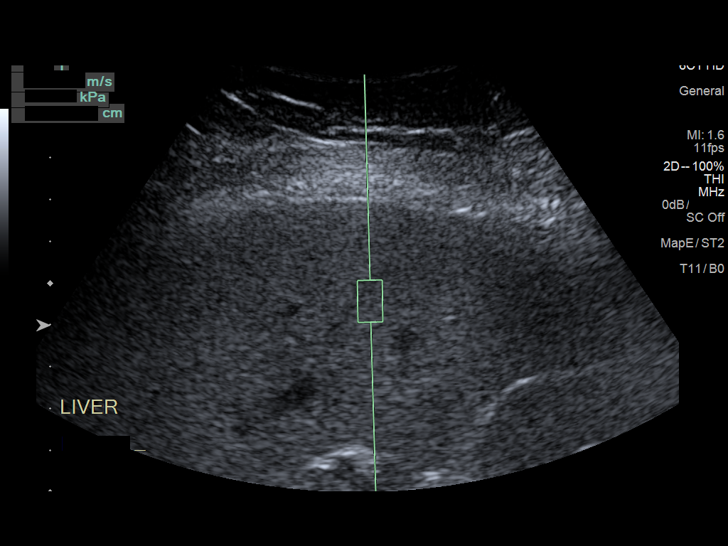
[im 17/17]
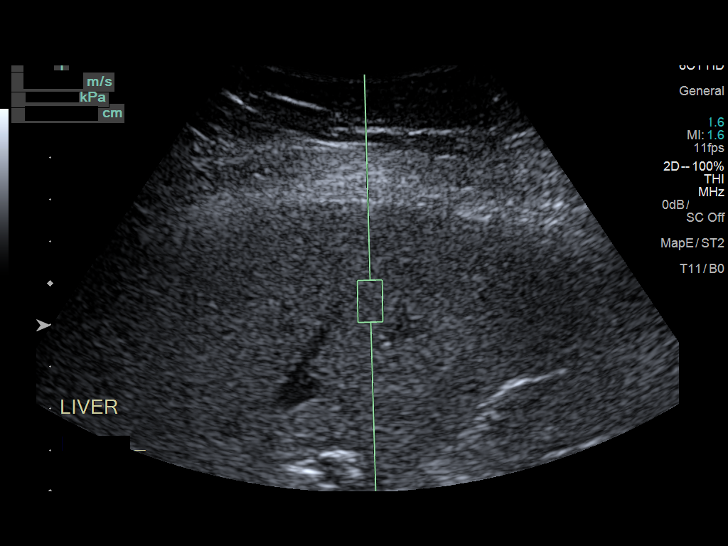

[13 of 17 positions shown; findings below may reference images not displayed]

FINDINGS: ULTRASOUND ABDOMEN

Gallbladder: No gallstones or wall thickening visualized. No
sonographic Murphy sign noted by sonographer.

Common bile duct: Diameter: 3 mm, within normal limits.

Liver: Mildly increased echogenicity of the hepatic parenchyma,
consistent with hepatic steatosis/diffuse hepatocellular disease. No
focal mass lesion identified.

IVC: No abnormality visualized.

Pancreas: Visualized portion unremarkable.

Spleen: Size and appearance within normal limits.

Right Kidney: Length: 11.2 cm. Echogenicity within normal limits. No
mass or hydronephrosis visualized.

Left Kidney: Length: 10.6 cm. Echogenicity within normal limits. No
mass or hydronephrosis visualized.

Abdominal aorta: No aneurysm visualized.

Other findings: None.

ULTRASOUND HEPATIC ELASTOGRAPHY

Device: Siemens Helix VTQ

Patient position: Left Lateral Decubitus

Transducer [id number]

Number of measurements: 10

Hepatic segment:  8

Median velocity:   4.07  m/sec

IQR:

IQR/Median velocity ratio:

Corresponding Metavir fibrosis score:  Some F3 + F4

Risk of fibrosis: High

Limitations of exam: None

Pertinent findings noted on other imaging exams:  None

Please note that abnormal shear wave velocities may also be
identified in clinical settings other than with hepatic fibrosis,
such as: acute hepatitis, elevated right heart and central venous
pressures including use of beta blockers, Warssame disease
([place]), infiltrative processes such as
mastocytosis/amyloidosis/infiltrative tumor, extrahepatic
cholestasis, in the post-prandial state, and liver transplantation.
Correlation with patient history, laboratory data, and clinical
condition recommended.
IMPRESSION: ULTRASOUND ABDOMEN:
Mild diffuse hepatic steatosis/hepatocellular disease. No liver mass
visualized.

No evidence of gallstones or biliary ductal dilatation. No evidence
of splenomegaly.

ULTRASOUND HEPATIC ELASTOGRAPHY:

Median hepatic shear wave velocity is calculated at 4.07 m/sec.

Corresponding Metavir fibrosis score is  Some F3 + F4.

Risk of fibrosis is High.

Follow-up: Follow up advised

## 2017-03-17 IMAGING — US US ABDOMEN COMPLETE W/ ELASTOGRAPHY
1 series · 13 of 25 positions shown · non-contrast
Comparison: Abdomen ultrasound on 09/14/2015 from [facility]

CLINICAL DATA: Hepatic steatosis. Elevated alkaline phosphatase
level.



[Series 1: us abdomen complete w/ elastography · 0.23mm/px · 13 of 73 slices shown]
[im 1/73]
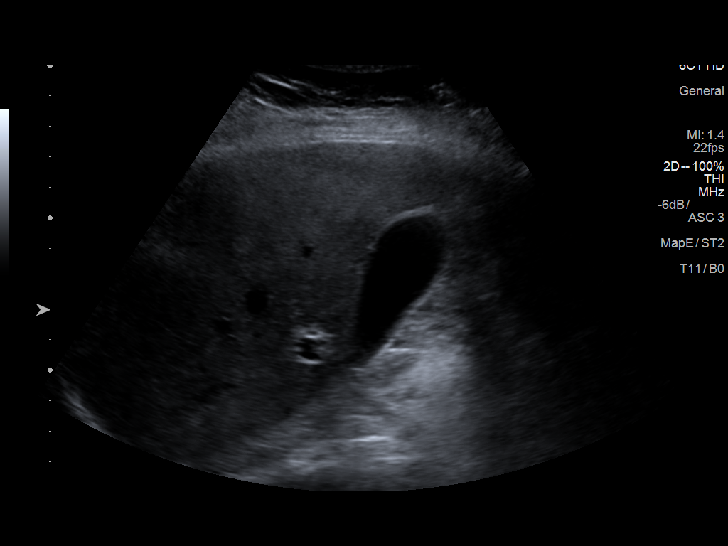
[im 7/73]
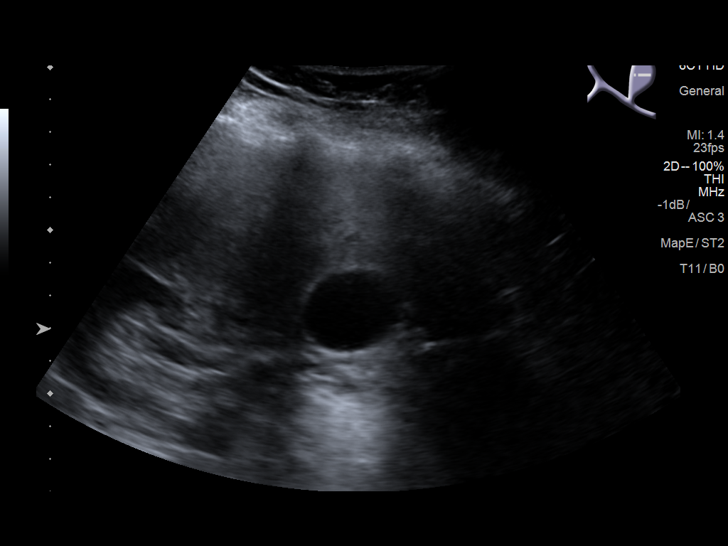
[im 13/73]
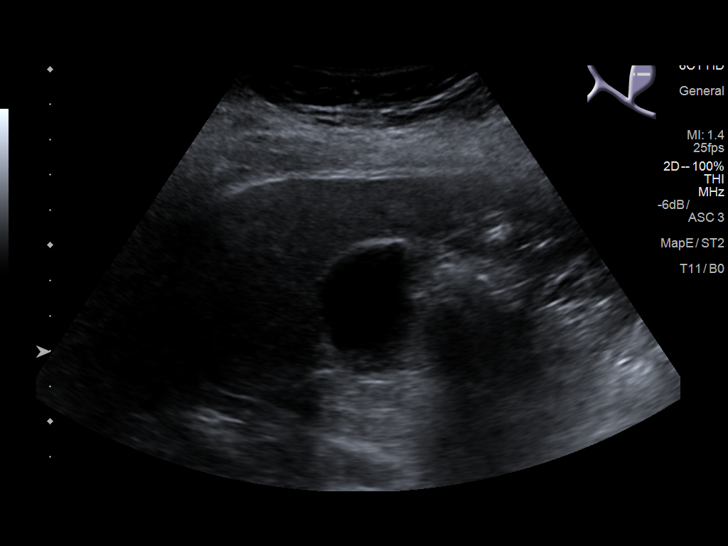
[im 19/73]
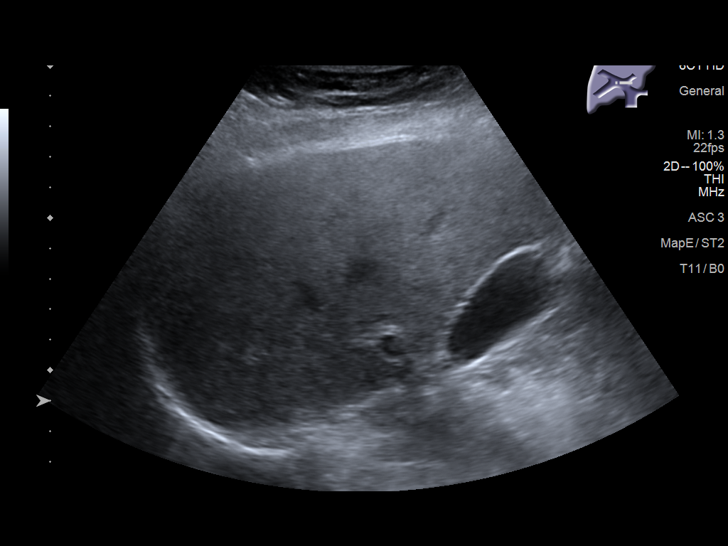
[im 25/73]
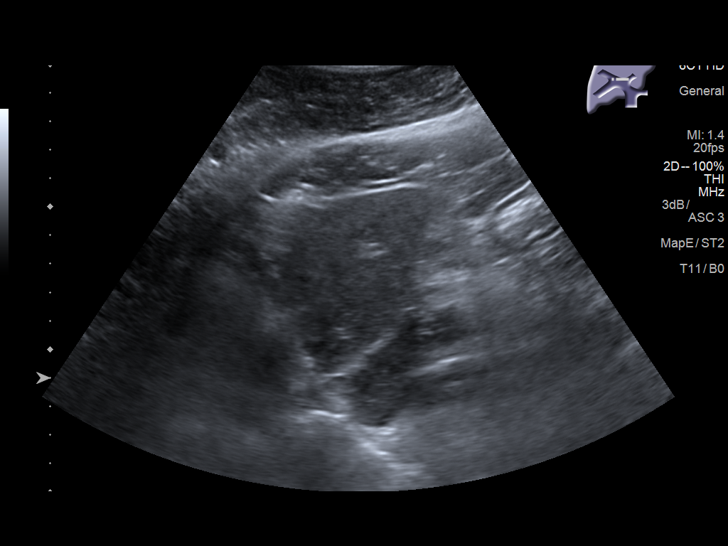
[im 31/73]
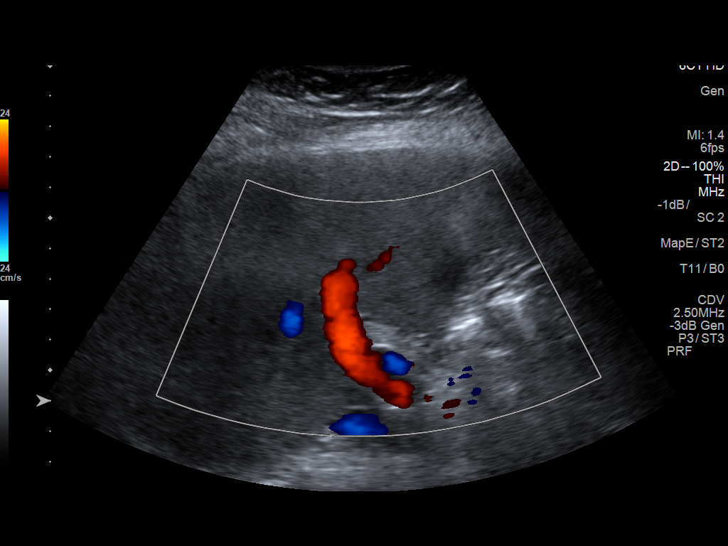
[im 37/73]
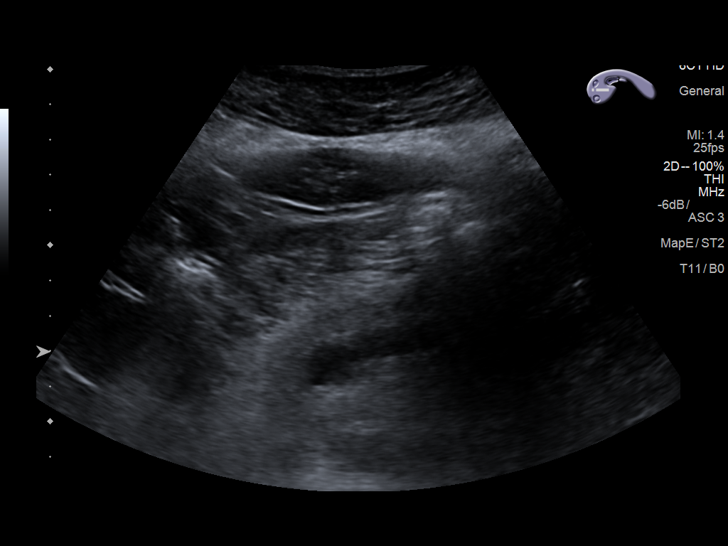
[im 43/73]
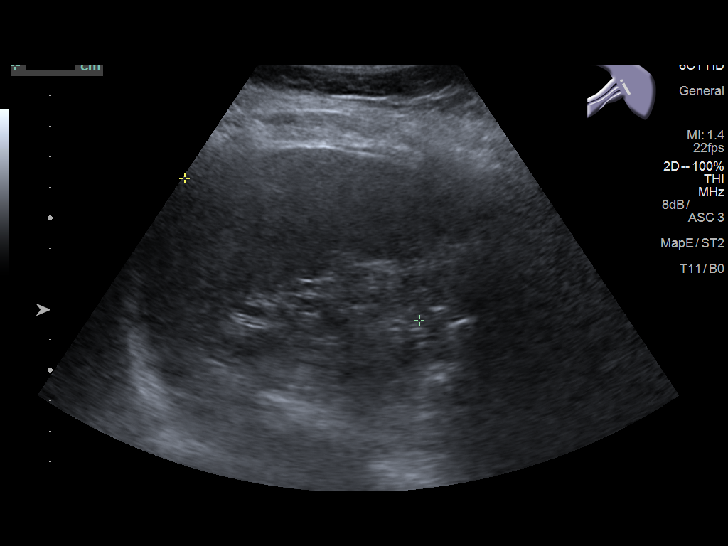
[im 49/73]
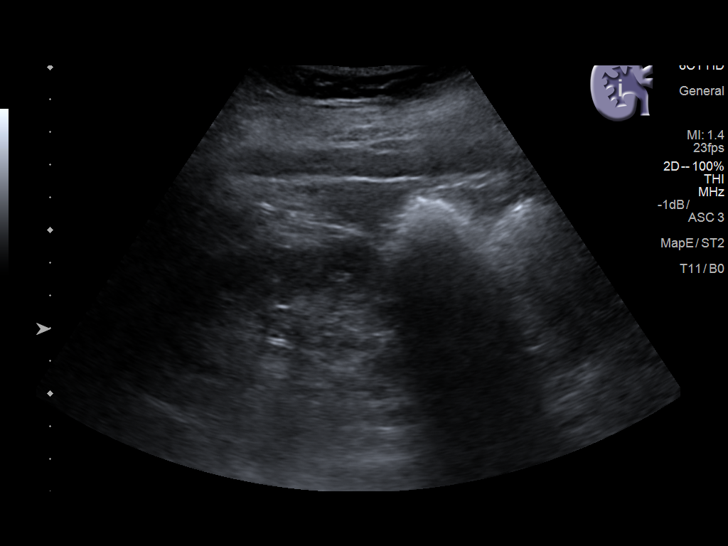
[im 55/73]
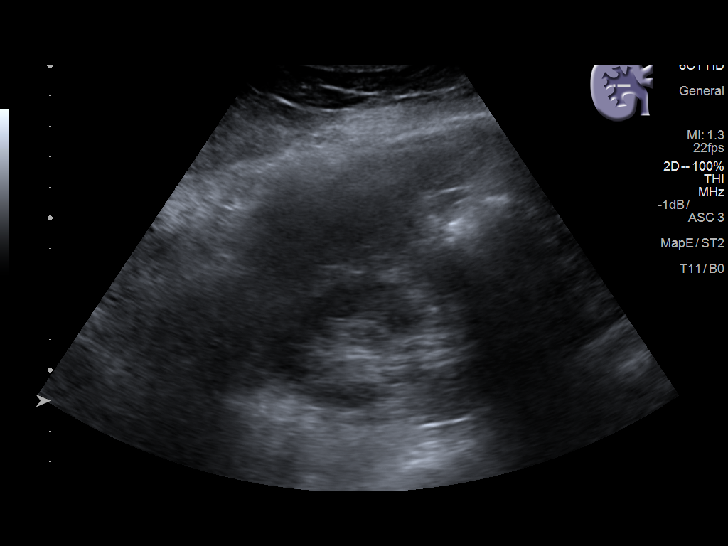
[im 61/73]
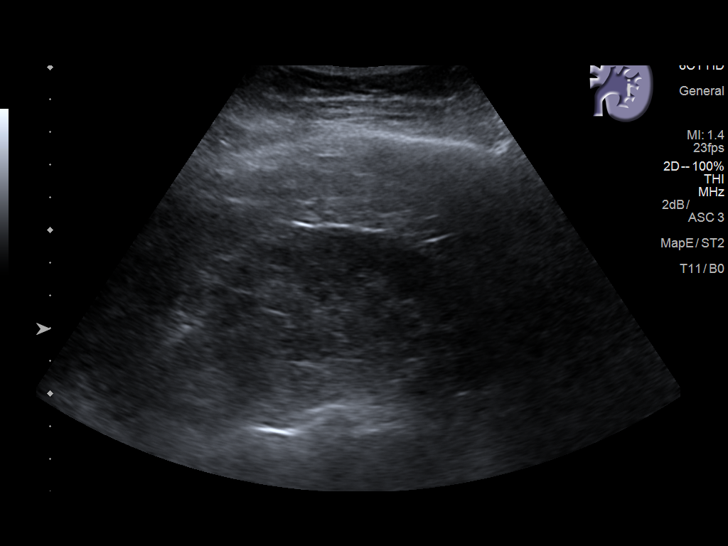
[im 67/73]
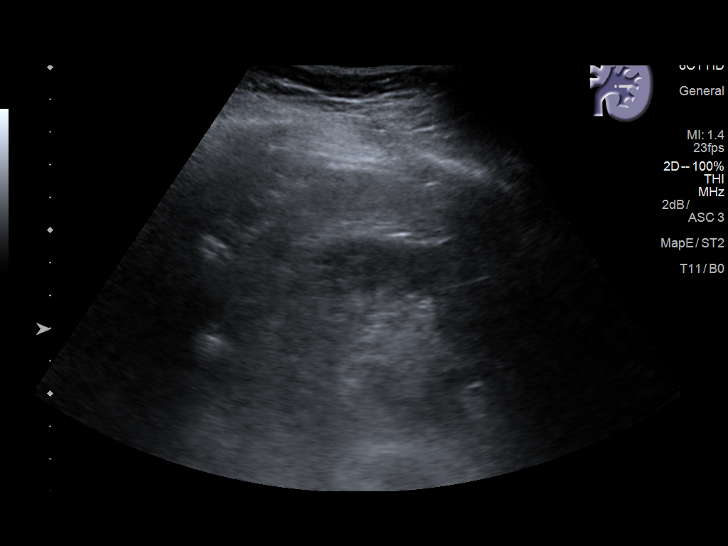
[im 73/73]
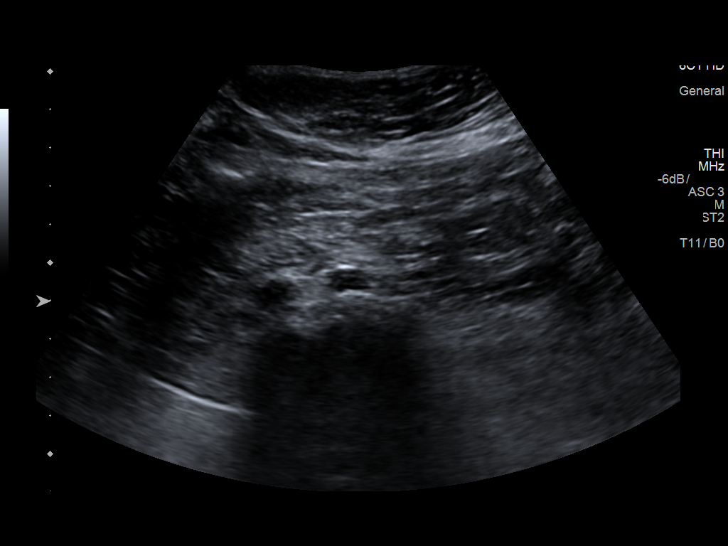

[13 of 25 positions shown; findings below may reference images not displayed]

FINDINGS: ULTRASOUND ABDOMEN

Gallbladder: No gallstones or wall thickening visualized. No
sonographic Murphy sign noted by sonographer.

Common bile duct: Diameter: 3 mm, within normal limits.

Liver: Mildly increased echogenicity of the hepatic parenchyma,
consistent with hepatic steatosis/diffuse hepatocellular disease. No
focal mass lesion identified.

IVC: No abnormality visualized.

Pancreas: Visualized portion unremarkable.

Spleen: Size and appearance within normal limits.

Right Kidney: Length: 11.2 cm. Echogenicity within normal limits. No
mass or hydronephrosis visualized.

Left Kidney: Length: 10.6 cm. Echogenicity within normal limits. No
mass or hydronephrosis visualized.

Abdominal aorta: No aneurysm visualized.

Other findings: None.

ULTRASOUND HEPATIC ELASTOGRAPHY

Device: Siemens Helix VTQ

Patient position: Left Lateral Decubitus

Transducer [id number]

Number of measurements: 10

Hepatic segment:  8

Median velocity:   4.07  m/sec

IQR:

IQR/Median velocity ratio:

Corresponding Metavir fibrosis score:  Some F3 + F4

Risk of fibrosis: High

Limitations of exam: None

Pertinent findings noted on other imaging exams:  None

Please note that abnormal shear wave velocities may also be
identified in clinical settings other than with hepatic fibrosis,
such as: acute hepatitis, elevated right heart and central venous
pressures including use of beta blockers, Warssame disease
([place]), infiltrative processes such as
mastocytosis/amyloidosis/infiltrative tumor, extrahepatic
cholestasis, in the post-prandial state, and liver transplantation.
Correlation with patient history, laboratory data, and clinical
condition recommended.
IMPRESSION: ULTRASOUND ABDOMEN:
Mild diffuse hepatic steatosis/hepatocellular disease. No liver mass
visualized.

No evidence of gallstones or biliary ductal dilatation. No evidence
of splenomegaly.

ULTRASOUND HEPATIC ELASTOGRAPHY:

Median hepatic shear wave velocity is calculated at 4.07 m/sec.

Corresponding Metavir fibrosis score is  Some F3 + F4.

Risk of fibrosis is High.

Follow-up: Follow up advised
